# Patient Record
Sex: Male | Born: 1995 | Race: White | Hispanic: No | Marital: Single | Smoking: Never smoker
Health system: Southern US, Community
[De-identification: ages and names within clinical notes are randomized; demographics above are authoritative.]

---
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
## 2014-02-16 ENCOUNTER — Encounter (HOSPITAL_COMMUNITY): Payer: Self-pay | Admitting: Acute Care

## 2014-02-16 ENCOUNTER — Emergency Department (HOSPITAL_COMMUNITY): Payer: No Typology Code available for payment source

## 2014-02-16 ENCOUNTER — Inpatient Hospital Stay (HOSPITAL_COMMUNITY)
Admission: EM | Admit: 2014-02-16 | Discharge: 2014-02-20 | DRG: 815 | Disposition: A | Discharge status: Home / Self Care | Payer: No Typology Code available for payment source | Attending: General Surgery | Admitting: General Surgery

## 2014-02-16 DIAGNOSIS — R109 Unspecified abdominal pain: Secondary | ICD-10-CM | POA: Diagnosis not present

## 2014-02-16 DIAGNOSIS — D62 Acute posthemorrhagic anemia: Secondary | ICD-10-CM | POA: Diagnosis not present

## 2014-02-16 DIAGNOSIS — S42102A Fracture of unspecified part of scapula, left shoulder, initial encounter for closed fracture: Secondary | ICD-10-CM | POA: Diagnosis present

## 2014-02-16 DIAGNOSIS — S2239XA Fracture of one rib, unspecified side, initial encounter for closed fracture: Secondary | ICD-10-CM | POA: Diagnosis present

## 2014-02-16 DIAGNOSIS — S51012A Laceration without foreign body of left elbow, initial encounter: Secondary | ICD-10-CM | POA: Diagnosis present

## 2014-02-16 DIAGNOSIS — F1010 Alcohol abuse, uncomplicated: Secondary | ICD-10-CM | POA: Diagnosis present

## 2014-02-16 DIAGNOSIS — S3609XA Other injury of spleen, initial encounter: Secondary | ICD-10-CM | POA: Diagnosis not present

## 2014-02-16 DIAGNOSIS — S36039A Unspecified laceration of spleen, initial encounter: Secondary | ICD-10-CM

## 2014-02-16 DIAGNOSIS — IMO0002 Reserved for concepts with insufficient information to code with codable children: Secondary | ICD-10-CM | POA: Diagnosis present

## 2014-02-16 DIAGNOSIS — S42199A Fracture of other part of scapula, unspecified shoulder, initial encounter for closed fracture: Secondary | ICD-10-CM | POA: Diagnosis present

## 2014-02-16 DIAGNOSIS — S2232XA Fracture of one rib, left side, initial encounter for closed fracture: Secondary | ICD-10-CM | POA: Diagnosis present

## 2014-02-16 LAB — SAMPLE TO BLOOD BANK

## 2014-02-16 LAB — URINALYSIS, ROUTINE W REFLEX MICROSCOPIC
BILIRUBIN URINE: NEGATIVE
Glucose, UA: NEGATIVE mg/dL
KETONES UR: NEGATIVE mg/dL
Leukocytes, UA: NEGATIVE
Nitrite: NEGATIVE
PROTEIN: NEGATIVE mg/dL
Specific Gravity, Urine: 1.043 — ABNORMAL HIGH (ref 1.005–1.030)
Urobilinogen, UA: 1 mg/dL (ref 0.0–1.0)
pH: 6 (ref 5.0–8.0)

## 2014-02-16 LAB — I-STAT CHEM 8, ED
BUN: 12 mg/dL (ref 6–23)
CREATININE: 1.1 mg/dL (ref 0.50–1.35)
Calcium, Ion: 1.07 mmol/L — ABNORMAL LOW (ref 1.12–1.23)
Chloride: 108 mEq/L (ref 96–112)
GLUCOSE: 97 mg/dL (ref 70–99)
HCT: 41 % (ref 39.0–52.0)
HEMOGLOBIN: 13.9 g/dL (ref 13.0–17.0)
POTASSIUM: 4 meq/L (ref 3.7–5.3)
Sodium: 136 mEq/L — ABNORMAL LOW (ref 137–147)
TCO2: 25 mmol/L (ref 0–100)

## 2014-02-16 LAB — URINE MICROSCOPIC-ADD ON

## 2014-02-16 LAB — COMPREHENSIVE METABOLIC PANEL
ALT: 18 U/L (ref 0–53)
AST: 35 U/L (ref 0–37)
Albumin: 4.3 g/dL (ref 3.5–5.2)
Alkaline Phosphatase: 89 U/L (ref 39–117)
Anion gap: 13 (ref 5–15)
BUN: 13 mg/dL (ref 6–23)
CALCIUM: 9.1 mg/dL (ref 8.4–10.5)
CO2: 25 mEq/L (ref 19–32)
Chloride: 101 mEq/L (ref 96–112)
Creatinine, Ser: 1.02 mg/dL (ref 0.50–1.35)
GFR calc non Af Amer: >90 mL/min (ref >90)
Glucose, Bld: 96 mg/dL (ref 70–99)
Potassium: 4.3 mEq/L (ref 3.7–5.3)
SODIUM: 139 meq/L (ref 137–147)
TOTAL PROTEIN: 7.1 g/dL (ref 6.0–8.3)
Total Bilirubin: 1 mg/dL (ref 0.3–1.2)

## 2014-02-16 LAB — ETHANOL: ALCOHOL ETHYL (B): 56 mg/dL — AB (ref 0–11)

## 2014-02-16 LAB — CBC
HCT: 38.6 % — ABNORMAL LOW (ref 39.0–52.0)
HEMOGLOBIN: 13.5 g/dL (ref 13.0–17.0)
MCH: 28.6 pg (ref 26.0–34.0)
MCHC: 35 g/dL (ref 30.0–36.0)
MCV: 81.8 fL (ref 78.0–100.0)
Platelets: 231 10*3/uL (ref 150–400)
RBC: 4.72 MIL/uL (ref 4.22–5.81)
RDW: 13.2 % (ref 11.5–15.5)
WBC: 21 10*3/uL — ABNORMAL HIGH (ref 4.0–10.5)

## 2014-02-16 LAB — HEMOGLOBIN AND HEMATOCRIT, BLOOD
HCT: 39.9 % (ref 39.0–52.0)
HEMATOCRIT: 38 % — AB (ref 39.0–52.0)
HEMOGLOBIN: 14 g/dL (ref 13.0–17.0)
Hemoglobin: 12.9 g/dL — ABNORMAL LOW (ref 13.0–17.0)

## 2014-02-16 LAB — PROTIME-INR
INR: 1.11 (ref 0.00–1.49)
Prothrombin Time: 14.3 seconds (ref 11.6–15.2)

## 2014-02-16 LAB — I-STAT CG4 LACTIC ACID, ED: Lactic Acid, Venous: 1.6 mmol/L (ref 0.5–2.2)

## 2014-02-16 LAB — MRSA PCR SCREENING: MRSA by PCR: NEGATIVE

## 2014-02-16 MED ORDER — PANTOPRAZOLE SODIUM 40 MG PO TBEC
40.0000 mg | DELAYED_RELEASE_TABLET | Freq: Every day | ORAL | Status: DC
  Administered 2014-02-17: 40 mg via ORAL
  Filled 2014-02-16: qty 1

## 2014-02-16 MED ORDER — LACTATED RINGERS IV BOLUS (SEPSIS)
1000.0000 mL | Freq: Once | INTRAVENOUS | Status: AC
  Administered 2014-02-16: 1000 mL via INTRAVENOUS

## 2014-02-16 MED ORDER — HYDROMORPHONE HCL PF 1 MG/ML IJ SOLN
1.0000 mg | INTRAMUSCULAR | Status: DC | PRN
  Administered 2014-02-16 – 2014-02-17 (×3): 1 mg via INTRAVENOUS
  Filled 2014-02-16 (×3): qty 1

## 2014-02-16 MED ORDER — PANTOPRAZOLE SODIUM 40 MG IV SOLR
40.0000 mg | Freq: Every day | INTRAVENOUS | Status: DC
  Administered 2014-02-16: 40 mg via INTRAVENOUS
  Filled 2014-02-16 (×3): qty 40

## 2014-02-16 MED ORDER — DEXTROSE-NACL 5-0.9 % IV SOLN
INTRAVENOUS | Status: DC
  Administered 2014-02-16 – 2014-02-17 (×2): via INTRAVENOUS

## 2014-02-16 MED ORDER — SODIUM CHLORIDE 0.9 % IV SOLN: Freq: Once | INTRAVENOUS | Status: DC

## 2014-02-16 MED ORDER — ONDANSETRON HCL 4 MG/2ML IJ SOLN: 4.0000 mg | Freq: Four times a day (QID) | INTRAMUSCULAR | Status: DC | PRN

## 2014-02-16 MED ORDER — HYDROMORPHONE HCL PF 1 MG/ML IJ SOLN
1.0000 mg | Freq: Once | INTRAMUSCULAR | Status: AC
  Administered 2014-02-16: 1 mg via INTRAVENOUS
  Filled 2014-02-16: qty 1

## 2014-02-16 MED ORDER — ONDANSETRON HCL 4 MG/2ML IJ SOLN
4.0000 mg | Freq: Once | INTRAMUSCULAR | Status: AC
  Administered 2014-02-16: 4 mg via INTRAVENOUS
  Filled 2014-02-16: qty 2

## 2014-02-16 MED ORDER — SODIUM CHLORIDE 0.9 % IV BOLUS (SEPSIS)
1000.0000 mL | Freq: Once | INTRAVENOUS | Status: AC
  Administered 2014-02-16: 1000 mL via INTRAVENOUS

## 2014-02-16 MED ORDER — ONDANSETRON HCL 4 MG PO TABS: 4.0000 mg | ORAL_TABLET | Freq: Four times a day (QID) | ORAL | Status: DC | PRN

## 2014-02-16 MED ORDER — IOHEXOL 300 MG/ML  SOLN
80.0000 mL | Freq: Once | INTRAMUSCULAR | Status: AC | PRN
  Administered 2014-02-16: 80 mL via INTRAVENOUS

## 2014-02-16 NOTE — ED Notes (Signed)
Patient transported to CT 

## 2014-02-16 NOTE — ED Notes (Signed)
Dr Derrell Lollingramirez here to eval pt
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
## 2014-02-16 NOTE — ED Notes (Signed)
Report given to angela, rn
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
## 2014-02-17 DIAGNOSIS — D62 Acute posthemorrhagic anemia: Secondary | ICD-10-CM | POA: Diagnosis not present

## 2014-02-17 DIAGNOSIS — S2232XA Fracture of one rib, left side, initial encounter for closed fracture: Secondary | ICD-10-CM | POA: Diagnosis present

## 2014-02-17 DIAGNOSIS — R109 Unspecified abdominal pain: Secondary | ICD-10-CM | POA: Diagnosis present

## 2014-02-17 DIAGNOSIS — IMO0002 Reserved for concepts with insufficient information to code with codable children: Secondary | ICD-10-CM | POA: Diagnosis present

## 2014-02-17 DIAGNOSIS — S42199A Fracture of other part of scapula, unspecified shoulder, initial encounter for closed fracture: Secondary | ICD-10-CM | POA: Diagnosis present

## 2014-02-17 DIAGNOSIS — S3609XA Other injury of spleen, initial encounter: Secondary | ICD-10-CM | POA: Diagnosis present

## 2014-02-17 DIAGNOSIS — F1010 Alcohol abuse, uncomplicated: Secondary | ICD-10-CM | POA: Diagnosis present

## 2014-02-17 DIAGNOSIS — S42109A Fracture of unspecified part of scapula, unspecified shoulder, initial encounter for closed fracture: Secondary | ICD-10-CM

## 2014-02-17 DIAGNOSIS — S2239XA Fracture of one rib, unspecified side, initial encounter for closed fracture: Secondary | ICD-10-CM

## 2014-02-17 DIAGNOSIS — S36039A Unspecified laceration of spleen, initial encounter: Secondary | ICD-10-CM | POA: Diagnosis present

## 2014-02-17 DIAGNOSIS — S42102A Fracture of unspecified part of scapula, left shoulder, initial encounter for closed fracture: Secondary | ICD-10-CM | POA: Diagnosis present

## 2014-02-17 DIAGNOSIS — S51012A Laceration without foreign body of left elbow, initial encounter: Secondary | ICD-10-CM | POA: Diagnosis present

## 2014-02-17 LAB — BASIC METABOLIC PANEL
Anion gap: 10 (ref 5–15)
BUN: 9 mg/dL (ref 6–23)
CALCIUM: 8.5 mg/dL (ref 8.4–10.5)
CO2: 24 mEq/L (ref 19–32)
Chloride: 104 mEq/L (ref 96–112)
Creatinine, Ser: 0.98 mg/dL (ref 0.50–1.35)
GFR calc Af Amer: >90 mL/min (ref >90)
GFR calc non Af Amer: >90 mL/min (ref >90)
GLUCOSE: 122 mg/dL — AB (ref 70–99)
Potassium: 3.6 mEq/L — ABNORMAL LOW (ref 3.7–5.3)
Sodium: 138 mEq/L (ref 137–147)

## 2014-02-17 LAB — CBC
HEMATOCRIT: 37.4 % — AB (ref 39.0–52.0)
Hemoglobin: 12.9 g/dL — ABNORMAL LOW (ref 13.0–17.0)
MCH: 28.7 pg (ref 26.0–34.0)
MCHC: 34.5 g/dL (ref 30.0–36.0)
MCV: 83.3 fL (ref 78.0–100.0)
Platelets: 188 10*3/uL (ref 150–400)
RBC: 4.49 MIL/uL (ref 4.22–5.81)
RDW: 13.2 % (ref 11.5–15.5)
WBC: 11.4 10*3/uL — ABNORMAL HIGH (ref 4.0–10.5)

## 2014-02-17 LAB — HEMOGLOBIN AND HEMATOCRIT, BLOOD
HCT: 41.6 % (ref 39.0–52.0)
HEMATOCRIT: 40.8 % (ref 39.0–52.0)
Hemoglobin: 14 g/dL (ref 13.0–17.0)
Hemoglobin: 14.1 g/dL (ref 13.0–17.0)

## 2014-02-17 MED ORDER — OXYCODONE HCL 5 MG PO TABS
5.0000 mg | ORAL_TABLET | ORAL | Status: DC | PRN
  Administered 2014-02-17 – 2014-02-20 (×6): 10 mg via ORAL
  Filled 2014-02-17 (×3): qty 2
  Filled 2014-02-17: qty 3
  Filled 2014-02-17 (×4): qty 2

## 2014-02-17 MED ORDER — DOCUSATE SODIUM 100 MG PO CAPS
100.0000 mg | ORAL_CAPSULE | Freq: Two times a day (BID) | ORAL | Status: DC
  Administered 2014-02-17 – 2014-02-19 (×6): 100 mg via ORAL
  Filled 2014-02-17 (×6): qty 1

## 2014-02-17 MED ORDER — MORPHINE SULFATE 2 MG/ML IJ SOLN: 2.0000 mg | INTRAMUSCULAR | Status: DC | PRN

## 2014-02-17 MED ORDER — BACITRACIN ZINC 500 UNIT/GM EX OINT
TOPICAL_OINTMENT | Freq: Two times a day (BID) | CUTANEOUS | Status: DC
  Administered 2014-02-17: 11:00:00 via TOPICAL
  Administered 2014-02-18: 1 via TOPICAL
  Administered 2014-02-19 (×2): via TOPICAL
  Filled 2014-02-17: qty 28.35
  Filled 2014-02-17 (×2): qty 15

## 2014-02-17 MED ORDER — SODIUM CHLORIDE 0.9 % IJ SOLN: 3.0000 mL | INTRAMUSCULAR | Status: DC | PRN

## 2014-02-17 MED ORDER — POLYETHYLENE GLYCOL 3350 17 G PO PACK
17.0000 g | PACK | Freq: Every day | ORAL | Status: DC
  Administered 2014-02-17 – 2014-02-19 (×3): 17 g via ORAL
  Filled 2014-02-17 (×6): qty 1

## 2014-02-17 NOTE — Progress Notes (Signed)
Patient is stable and I will allow him to eat.  Bedrest through tomorrow.  Ambulate on Wednesday.  Likely home on Thursday.  This patient has been seen and I agree with the findings and treatment plan.  Marta LamasJames O. Gae BonWyatt, III, MD, FACS [phone] (pager) [phone] (direct pager) Trauma Surgeon

## 2014-02-17 NOTE — Progress Notes (Signed)
Orthopedic Tech Progress Note Patient Details:  Micheal Bates 11/24/1995 [id number]  Ortho Devices Type of Ortho Device: Arm sling Ortho Device/Splint Location: LUE at bedside Ortho Device/Splint Interventions: Casandra DoffingOrdered   Aaliyah Gavel, Rylan Craig 02/17/2014, 7:35 PM
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
## 2014-02-18 LAB — HEMOGLOBIN AND HEMATOCRIT, BLOOD
HCT: 40.5 % (ref 39.0–52.0)
HEMATOCRIT: 41.2 % (ref 39.0–52.0)
HEMOGLOBIN: 13.8 g/dL (ref 13.0–17.0)
HEMOGLOBIN: 14.2 g/dL (ref 13.0–17.0)

## 2014-02-18 NOTE — Progress Notes (Signed)
Ambulate tomorrow.  Recheck labs and home on Thursday.  This patient has been seen and I agree with the findings and treatment plan.  Marta LamasJames O. Gae BonWyatt, III, MD, FACS [phone] (pager) [phone] (direct pager) Trauma Surgeon
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
## 2014-02-19 LAB — CBC
HEMATOCRIT: 40.8 % (ref 39.0–52.0)
Hemoglobin: 14 g/dL (ref 13.0–17.0)
MCH: 28.1 pg (ref 26.0–34.0)
MCHC: 34.3 g/dL (ref 30.0–36.0)
MCV: 81.9 fL (ref 78.0–100.0)
Platelets: 194 10*3/uL (ref 150–400)
RBC: 4.98 MIL/uL (ref 4.22–5.81)
RDW: 13 % (ref 11.5–15.5)
WBC: 7 10*3/uL (ref 4.0–10.5)
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
## 2014-02-19 NOTE — Progress Notes (Signed)
Hemoglobin stable.  Will ambulate and send home tomorrow as long as he remains stable.  He needs a letter to go back to work in 3 weeks. This patient has been seen and I agree with the findings and treatment plan.  Marta LamasJames O. Gae BonWyatt, III, MD, FACS [phone] (pager) [phone] (direct pager) Trauma Surgeon
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
## 2014-02-20 ENCOUNTER — Encounter (INDEPENDENT_AMBULATORY_CARE_PROVIDER_SITE_OTHER): Payer: Self-pay | Admitting: Orthopedic Surgery

## 2014-02-20 LAB — CBC
HCT: 39.1 % (ref 39.0–52.0)
Hemoglobin: 13.8 g/dL (ref 13.0–17.0)
MCH: 28.5 pg (ref 26.0–34.0)
MCHC: 35.3 g/dL (ref 30.0–36.0)
MCV: 80.8 fL (ref 78.0–100.0)
PLATELETS: 194 10*3/uL (ref 150–400)
RBC: 4.84 MIL/uL (ref 4.22–5.81)
RDW: 12.7 % (ref 11.5–15.5)
WBC: 6.1 10*3/uL (ref 4.0–10.5)

## 2014-02-20 MED ORDER — OXYCODONE-ACETAMINOPHEN 5-325 MG PO TABS: 1.0000 | ORAL_TABLET | ORAL | Status: DC | PRN

## 2014-02-20 NOTE — Discharge Instructions (Signed)
No running, jumping, ball or contact sports, bikes, skateboards, etc for 3 months.  No driving while taking oxycodone.

## 2014-02-20 NOTE — Discharge Summary (Signed)
Enez Monahan, MD, MPH, FACS Trauma: [phone] General Surgery: [phone]  
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
## 2014-02-20 NOTE — Progress Notes (Signed)
D/C Jobie Popp, MD, MPH, FACS Trauma: [phone] General Surgery: [phone]  

## 2014-02-20 NOTE — Progress Notes (Signed)
Discharge paperwork and prescriptions given to patient. Patient is prepared for discharge. No questons verbalized by patient or family.
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
## 2014-03-11 ENCOUNTER — Encounter (HOSPITAL_COMMUNITY): Payer: Self-pay | Admitting: Emergency Medicine

## 2014-03-11 ENCOUNTER — Emergency Department (HOSPITAL_COMMUNITY)
Admission: EM | Admit: 2014-03-11 | Discharge: 2014-03-11 | Disposition: A | Discharge status: Home / Self Care | Payer: BC Managed Care – PPO | Attending: Emergency Medicine | Admitting: Emergency Medicine

## 2014-03-11 DIAGNOSIS — Z8781 Personal history of (healed) traumatic fracture: Secondary | ICD-10-CM | POA: Diagnosis not present

## 2014-03-11 DIAGNOSIS — M25512 Pain in left shoulder: Secondary | ICD-10-CM

## 2014-03-11 DIAGNOSIS — Y92410 Unspecified street and highway as the place of occurrence of the external cause: Secondary | ICD-10-CM | POA: Insufficient documentation

## 2014-03-11 DIAGNOSIS — S46909A Unspecified injury of unspecified muscle, fascia and tendon at shoulder and upper arm level, unspecified arm, initial encounter: Secondary | ICD-10-CM | POA: Diagnosis not present

## 2014-03-11 DIAGNOSIS — Y9389 Activity, other specified: Secondary | ICD-10-CM | POA: Diagnosis not present

## 2014-03-11 DIAGNOSIS — S4980XA Other specified injuries of shoulder and upper arm, unspecified arm, initial encounter: Secondary | ICD-10-CM | POA: Diagnosis present

## 2014-03-11 DIAGNOSIS — X500XXA Overexertion from strenuous movement or load, initial encounter: Secondary | ICD-10-CM | POA: Diagnosis not present
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
## 2014-03-11 NOTE — Consult Note (Signed)
I have reviewed and discussed in detail with Mr. Micheal Bates the patient's presentation, examination findings, and I formulated the plan outlined above.  I have seen and examined the patient. I agree with the findings above.   Myrene Galas, MD [facility] [phone] (p)
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
## 2014-03-11 NOTE — ED Provider Notes (Signed)
Medical screening examination/treatment/procedure(s) were performed by non-physician practitioner and as supervising physician I was immediately available for consultation/collaboration.     Linwood Dibbles, MD 03/11/14 [phone]
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
## 2015-05-09 ENCOUNTER — Emergency Department
Admission: EM | Admit: 2015-05-09 | Discharge: 2015-05-09 | Disposition: A | Discharge status: Home / Self Care | Payer: BLUE CROSS/BLUE SHIELD | Attending: Emergency Medicine | Admitting: Emergency Medicine

## 2015-05-09 ENCOUNTER — Encounter: Payer: Self-pay | Admitting: Emergency Medicine

## 2015-05-09 DIAGNOSIS — S0003XA Contusion of scalp, initial encounter: Secondary | ICD-10-CM | POA: Diagnosis not present

## 2015-05-09 DIAGNOSIS — Y92410 Unspecified street and highway as the place of occurrence of the external cause: Secondary | ICD-10-CM | POA: Insufficient documentation

## 2015-05-09 DIAGNOSIS — S50312A Abrasion of left elbow, initial encounter: Secondary | ICD-10-CM | POA: Insufficient documentation

## 2015-05-09 DIAGNOSIS — S0990XA Unspecified injury of head, initial encounter: Secondary | ICD-10-CM | POA: Diagnosis present

## 2015-05-09 DIAGNOSIS — Y998 Other external cause status: Secondary | ICD-10-CM | POA: Diagnosis not present

## 2015-05-09 DIAGNOSIS — Y9389 Activity, other specified: Secondary | ICD-10-CM | POA: Diagnosis not present
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
## 2015-05-13 ENCOUNTER — Emergency Department: Payer: BLUE CROSS/BLUE SHIELD

## 2015-05-13 ENCOUNTER — Emergency Department
Admission: EM | Admit: 2015-05-13 | Discharge: 2015-05-13 | Disposition: A | Discharge status: Home / Self Care | Payer: BLUE CROSS/BLUE SHIELD | Attending: Emergency Medicine | Admitting: Emergency Medicine

## 2015-05-13 ENCOUNTER — Encounter: Payer: Self-pay | Admitting: *Deleted

## 2015-05-13 DIAGNOSIS — G44309 Post-traumatic headache, unspecified, not intractable: Secondary | ICD-10-CM | POA: Diagnosis not present

## 2015-05-13 DIAGNOSIS — R51 Headache: Secondary | ICD-10-CM | POA: Diagnosis present

## 2015-05-13 MED ORDER — BUTALBITAL-APAP-CAFFEINE 50-325-40 MG PO TABS: 1.0000 | ORAL_TABLET | Freq: Four times a day (QID) | ORAL | Status: AC | PRN
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
## 2019-04-16 ENCOUNTER — Other Ambulatory Visit: Payer: Self-pay

## 2019-04-16 ENCOUNTER — Emergency Department (HOSPITAL_COMMUNITY)
Admission: EM | Admit: 2019-04-16 | Discharge: 2019-04-17 | Disposition: A | Discharge status: Home / Self Care | Payer: BC Managed Care – PPO | Attending: Emergency Medicine | Admitting: Emergency Medicine

## 2019-04-16 ENCOUNTER — Encounter (HOSPITAL_COMMUNITY): Payer: Self-pay

## 2019-04-16 DIAGNOSIS — Y999 Unspecified external cause status: Secondary | ICD-10-CM | POA: Diagnosis not present

## 2019-04-16 DIAGNOSIS — Y929 Unspecified place or not applicable: Secondary | ICD-10-CM | POA: Diagnosis not present

## 2019-04-16 DIAGNOSIS — Z23 Encounter for immunization: Secondary | ICD-10-CM | POA: Insufficient documentation

## 2019-04-16 DIAGNOSIS — S61012A Laceration without foreign body of left thumb without damage to nail, initial encounter: Secondary | ICD-10-CM | POA: Diagnosis not present

## 2019-04-16 DIAGNOSIS — W268XXA Contact with other sharp object(s), not elsewhere classified, initial encounter: Secondary | ICD-10-CM | POA: Diagnosis not present

## 2019-04-16 DIAGNOSIS — Y9389 Activity, other specified: Secondary | ICD-10-CM | POA: Diagnosis not present

## 2019-04-16 MED ORDER — POVIDONE-IODINE 10 % EX SOLN
CUTANEOUS | Status: AC
  Administered 2019-04-17
  Filled 2019-04-16: qty 30
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
## 2019-04-17 MED ORDER — LIDOCAINE HCL (PF) 1 % IJ SOLN
10.0000 mL | Freq: Once | INTRAMUSCULAR | Status: AC
  Administered 2019-04-17: 10 mL
  Filled 2019-04-17: qty 30

## 2019-04-17 MED ORDER — ONDANSETRON HCL 4 MG PO TABS
4.0000 mg | ORAL_TABLET | Freq: Once | ORAL | Status: AC
Start: 2019-04-17 — End: 2019-04-17
  Administered 2019-04-17: 4 mg via ORAL
  Filled 2019-04-17: qty 1

## 2019-04-17 MED ORDER — HYDROCODONE-ACETAMINOPHEN 5-325 MG PO TABS
2.0000 | ORAL_TABLET | Freq: Once | ORAL | Status: AC
  Administered 2019-04-17: 2 via ORAL
  Filled 2019-04-17: qty 2

## 2019-04-17 MED ORDER — DOXYCYCLINE HYCLATE 100 MG PO TABS
100.0000 mg | ORAL_TABLET | Freq: Once | ORAL | Status: AC
  Administered 2019-04-17: 100 mg via ORAL
  Filled 2019-04-17: qty 1

## 2019-04-17 MED ORDER — TETANUS-DIPHTH-ACELL PERTUSSIS 5-2.5-18.5 LF-MCG/0.5 IM SUSP
0.5000 mL | Freq: Once | INTRAMUSCULAR | Status: AC
  Administered 2019-04-17: 0.5 mL via INTRAMUSCULAR
  Filled 2019-04-17: qty 0.5

## 2019-04-17 MED ORDER — DOXYCYCLINE HYCLATE 100 MG PO CAPS: 100.0000 mg | ORAL_CAPSULE | Freq: Two times a day (BID) | ORAL | 0 refills | Status: AC
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
## 2019-04-17 NOTE — Discharge Instructions (Signed)
Your wound was repaired with stitches.  You have 8 stitches on the outside.  These should be removed on Tuesday, October 20.  Please see your primary physician, or urgent care, or return to this emergency department to have them removed.  Please return to the emergency department sooner if any signs of infection.  Use Tylenol every 4 hours if needed for soreness.  Please use doxycycline 2 times daily as a prevention for infection.  Please take this medication with FOOD.

## 2022-09-08 ENCOUNTER — Emergency Department (HOSPITAL_COMMUNITY)
Admission: EM | Admit: 2022-09-08 | Discharge: 2022-09-08 | Disposition: A | Discharge status: Home / Self Care | Payer: BC Managed Care – PPO | Attending: Emergency Medicine | Admitting: Emergency Medicine

## 2022-09-08 ENCOUNTER — Emergency Department (HOSPITAL_COMMUNITY): Payer: BC Managed Care – PPO

## 2022-09-08 ENCOUNTER — Other Ambulatory Visit: Payer: Self-pay

## 2022-09-08 DIAGNOSIS — R0789 Other chest pain: Secondary | ICD-10-CM | POA: Insufficient documentation

## 2022-09-08 MED ORDER — LIDOCAINE 5 % EX PTCH
1.0000 | MEDICATED_PATCH | CUTANEOUS | Status: DC
  Administered 2022-09-08: 1 via TRANSDERMAL
  Filled 2022-09-08: qty 1

## 2022-09-08 MED ORDER — IBUPROFEN 800 MG PO TABS
800.0000 mg | ORAL_TABLET | Freq: Once | ORAL | Status: AC
  Administered 2022-09-08: 800 mg via ORAL
  Filled 2022-09-08: qty 1

## 2022-09-08 NOTE — ED Triage Notes (Signed)
Pt states his right side of his chest has been hurting x 2 weeks.  States when moves or takes a deep breath t hurts worse.  Denies N/V/D, SOB

## 2022-09-08 NOTE — Discharge Instructions (Signed)
As we discussed, I believe this is likely costochondritis or muscular related.  I would like for you to take 600 mg of ibuprofen every 6 hours.  You can Stach on Tylenol at the 3 to 4-hour mark.  I would try this for a week.  Like for you to return to the emergency department for any worsening symptoms you might have.

## 2022-09-08 NOTE — ED Provider Notes (Signed)
[facility] Provider Note   CSN: [id number] Arrival date & time: 09/08/22  1538     History Chief Complaint  Patient presents with   Chest Pain    Micheal Bates is a 27 y.o. male patient who presents to the emergency department with right-sided anterior chest wall pain has been ongoing for last 2 weeks.  He states is worse with rotation of the chest wall and with deep inhalation.  He states that since golfing a couple of weeks ago he noticed that has been progressively worse.  He denies any cough, fever, recent travel, recent surgeries.  Denies any trauma as well.   Chest Pain      Home Medications Prior to Admission medications   Medication Sig Start Date End Date Taking? Authorizing Provider  butalbital-acetaminophen-caffeine (FIORICET) 50-325-40 MG tablet Take 1-2 tablets by mouth every 6 (six) hours as needed for headache. 05/13/15   Sable Feil, PA-C  doxycycline (VIBRAMYCIN) 100 MG capsule Take 1 capsule (100 mg total) by mouth 2 (two) times daily. 04/17/19   Lily Kocher, PA-C  oxyCODONE-acetaminophen (ROXICET) 5-325 MG per tablet Take 1-2 tablets by mouth every 4 (four) hours as needed (Pain). 02/20/14   Lisette Abu, PA-C      Allergies    Shellfish allergy    Review of Systems   Review of Systems  Cardiovascular:  Positive for chest pain.  All other systems reviewed and are negative.   Physical Exam Updated Vital Signs BP 133/76 (BP Location: Right Arm)   Pulse 82   Temp 98 F (36.7 C) (Oral)   Resp 16   Ht '5\' 9"'$  (1.753 m)   Wt 74.8 kg   SpO2 100%   BMI 24.37 kg/m  Physical Exam Vitals and nursing note reviewed.  Constitutional:      Appearance: Normal appearance.  HENT:     Head: Normocephalic and atraumatic.  Eyes:     General:        Right eye: No discharge.        Left eye: No discharge.     Conjunctiva/sclera: Conjunctivae normal.  Pulmonary:     Effort: Pulmonary effort is  normal.  Chest:     Comments: Anterior chest wall on the right is tender to palpation inferior and lateral to the nipple. Skin:    General: Skin is warm and dry.     Findings: No rash.  Neurological:     General: No focal deficit present.     Mental Status: He is alert.  Psychiatric:        Mood and Affect: Mood normal.        Behavior: Behavior normal.     ED Results / Procedures / Treatments   Labs (all labs ordered are listed, but only abnormal results are displayed) Labs Reviewed - No data to display  EKG None  Radiology DG Ribs Unilateral W/Chest Right  Result Date: 09/08/2022 CLINICAL DATA:  Chest wall pain.  Right-sided pain for 2 weeks. EXAM: RIGHT RIBS AND CHEST - 3+ VIEW COMPARISON:  Chest CT 02/16/2014 FINDINGS: No fracture or other bone lesions are seen involving the ribs. There is no evidence of pneumothorax or pleural effusion. Both lungs are clear. Heart size and mediastinal contours are within normal limits. IMPRESSION: Negative radiographs of the chest and right ribs. Electronically Signed   By: Keith Rake M.D.   On: 09/08/2022 18:11    Procedures Procedures   Medications  Ordered in ED Medications  lidocaine (LIDODERM) 5 % 1 patch (1 patch Transdermal Patch Applied 09/08/22 1846)  ibuprofen (ADVIL) tablet 800 mg (800 mg Oral Given 09/08/22 1846)    ED Course/ Medical Decision Making/ A&P   {    Medical Decision Making Micheal Bates is a 27 y.o. male patient who presents to the emergency department today for further evaluation of right-sided chest wall pain.  There is tenderness to palpation over the right anterior chest wall.  Patient has not had any indications of pneumonia including fever, cough.  No trauma to indicate any flail chest.  Patient is otherwise young and healthy and not on any hormone replacement.  I have a low suspicion at this time for pulmonary embolism.  I will plan to get a chest x-ray to evaluate for rib injury and other lung  pathology.  I will plan to give him ibuprofen and Lidoderm patch and reassess.  Patient states that the Lidoderm patch did not improve his pain much.  I went over the imaging with him at the bedside.  I have a low suspicion for any emergent pathology at this time.  I will put him on conservative therapy with NSAIDs for the next week.  He was encouraged to return to the emergency department for any worsening symptoms.   Amount and/or Complexity of Data Reviewed Radiology: ordered.  Risk Prescription drug management.    Final Clinical Impression(s) / ED Diagnoses Final diagnoses:  Chest wall pain    Rx / DC Orders ED Discharge Orders     None         Cherrie Gauze 09/08/22 1901    Milton Ferguson, MD 09/09/22 1236

## 2023-07-09 ENCOUNTER — Emergency Department (HOSPITAL_COMMUNITY)
Admission: EM | Admit: 2023-07-09 | Discharge: 2023-07-09 | Disposition: A | Discharge status: Home / Self Care | Payer: BC Managed Care – PPO | Attending: Emergency Medicine | Admitting: Emergency Medicine

## 2023-07-09 ENCOUNTER — Other Ambulatory Visit: Payer: Self-pay

## 2023-07-09 ENCOUNTER — Encounter (HOSPITAL_COMMUNITY): Payer: Self-pay | Admitting: *Deleted

## 2023-07-09 ENCOUNTER — Emergency Department (HOSPITAL_COMMUNITY): Payer: BC Managed Care – PPO

## 2023-07-09 DIAGNOSIS — W19XXXA Unspecified fall, initial encounter: Secondary | ICD-10-CM | POA: Diagnosis not present

## 2023-07-09 DIAGNOSIS — S52572A Other intraarticular fracture of lower end of left radius, initial encounter for closed fracture: Secondary | ICD-10-CM | POA: Insufficient documentation

## 2023-07-09 DIAGNOSIS — M7989 Other specified soft tissue disorders: Secondary | ICD-10-CM | POA: Diagnosis not present

## 2023-07-09 DIAGNOSIS — S6991XA Unspecified injury of right wrist, hand and finger(s), initial encounter: Secondary | ICD-10-CM | POA: Diagnosis present

## 2023-07-09 MED ORDER — IBUPROFEN 600 MG PO TABS: 600.0000 mg | ORAL_TABLET | Freq: Four times a day (QID) | ORAL | 0 refills | Status: AC | PRN

## 2023-07-09 MED ORDER — HYDROCODONE-ACETAMINOPHEN 5-325 MG PO TABS: 1.0000 | ORAL_TABLET | Freq: Four times a day (QID) | ORAL | 0 refills | Status: DC | PRN

## 2023-07-09 MED ORDER — ACETAMINOPHEN 500 MG PO TABS
1000.0000 mg | ORAL_TABLET | Freq: Once | ORAL | Status: AC
  Administered 2023-07-09: 1000 mg via ORAL
  Filled 2023-07-09: qty 2

## 2023-07-09 MED ORDER — IBUPROFEN 400 MG PO TABS
600.0000 mg | ORAL_TABLET | Freq: Once | ORAL | Status: AC
  Administered 2023-07-09: 600 mg via ORAL
  Filled 2023-07-09: qty 2

## 2023-07-09 NOTE — ED Triage Notes (Signed)
 Pt fell yesterday and with left wrist pain.  Swelling noted to left hand.  Denies hitting his head with fall.

## 2023-07-09 NOTE — ED Provider Notes (Signed)
 [facility] EMERGENCY DEPARTMENT AT [facility] Provider Note   CSN: [id number] Arrival date & time: 07/09/23  1232     History  Chief Complaint  Patient presents with   Wrist Pain    Micheal Bates is a 28 y.o. male.   Wrist Pain       Home Medications Prior to Admission medications   Medication Sig Start Date End Date Taking? Authorizing Provider  HYDROcodone -acetaminophen  (NORCO) 5-325 MG tablet Take 1 tablet by mouth every 6 (six) hours as needed for moderate pain (pain score 4-6). 07/09/23  Yes Jilliann Subramanian A, PA-C  ibuprofen  (ADVIL ) 600 MG tablet Take 1 tablet (600 mg total) by mouth every 6 (six) hours as needed. 07/09/23  Yes Amarah Brossman A, PA-C  butalbital -acetaminophen -caffeine  (FIORICET) 50-325-40 MG tablet Take 1-2 tablets by mouth every 6 (six) hours as needed for headache. 05/13/15   Claudene Tanda POUR, PA-C  doxycycline  (VIBRAMYCIN ) 100 MG capsule Take 1 capsule (100 mg total) by mouth 2 (two) times daily. 04/17/19   Armida Culver, PA-C  oxyCODONE -acetaminophen  (ROXICET) 5-325 MG per tablet Take 1-2 tablets by mouth every 4 (four) hours as needed (Pain). 02/20/14   Jeffery, Michael J, PA-C      Allergies    Patient has no known allergies.    Review of Systems   Review of Systems  Physical Exam Updated Vital Signs BP 97/65 (BP Location: Right Arm)   Pulse 83   Temp 98.3 F (36.8 C) (Oral)   Resp 16   Ht 5' 8 (1.727 m)   Wt 74.8 kg   SpO2 100%   BMI 25.09 kg/m  Physical Exam Vitals and nursing note reviewed.  Constitutional:      General: He is not in acute distress.    Appearance: He is well-developed.  HENT:     Head: Normocephalic and atraumatic.  Eyes:     Conjunctiva/sclera: Conjunctivae normal.  Cardiovascular:     Rate and Rhythm: Normal rate and regular rhythm.     Heart sounds: No murmur heard. Pulmonary:     Effort: Pulmonary effort is normal. No respiratory distress.     Breath sounds: Normal breath sounds.   Abdominal:     Palpations: Abdomen is soft.     Tenderness: There is no abdominal tenderness.  Musculoskeletal:        General: Swelling present.     Cervical back: Neck supple.     Comments: Soft tissue swelling to the dorsum of the left hand and diffusely to the left wrist.  Point tenderness to the distal radius, radial pulses 2+.  Patient can make okay sign, thumbs up and abduct and adduct his fingers, and also he can flex and extend the wrist but has pain with extension.  Capillary refill is brisk in the fingers.  Skin:    General: Skin is warm and dry.     Capillary Refill: Capillary refill takes less than 2 seconds.  Neurological:     General: No focal deficit present.     Mental Status: He is alert and oriented to person, place, and time.  Psychiatric:        Mood and Affect: Mood normal.     ED Results / Procedures / Treatments   Labs (all labs ordered are listed, but only abnormal results are displayed) Labs Reviewed - No data to display  EKG None  Radiology DG Wrist Complete Left Result Date: 07/09/2023 CLINICAL DATA:  Fall, pain and swelling EXAM: LEFT  WRIST - COMPLETE 3+ VIEW COMPARISON:  None Available. FINDINGS: Nondisplaced, comminuted intra-articular fractures of the distal left radius. The distal ulna is intact and the carpus remains normally aligned. Soft tissue edema about the wrist. IMPRESSION: Nondisplaced, comminuted intra-articular fractures of the distal left radius. Electronically Signed   By: Marolyn JONETTA Jaksch M.D.   On: 07/09/2023 13:43    Procedures .Splint Application  Date/Time: 07/09/2023 2:12 PM  Performed by: Suellen Sherran LABOR, PA-C Authorized by: Suellen Sherran LABOR, PA-C   Consent:    Consent obtained:  Verbal   Consent given by:  Patient   Risks discussed:  Discoloration, numbness, pain and swelling Universal protocol:    Patient identity confirmed:  Verbally with patient Pre-procedure details:    Distal neurologic exam:  Normal   Distal  perfusion: distal pulses strong and brisk capillary refill   Procedure details:    Location:  Wrist   Wrist location:  L wrist   Cast type:  Short arm   Splint type:  Sugar tong   Supplies:  Elastic bandage, cotton padding, fiberglass and sling   Attestation: Splint applied and adjusted personally by me   Post-procedure details:    Distal neurologic exam:  Normal   Distal perfusion: distal pulses strong and brisk capillary refill     Procedure completion:  Tolerated well, no immediate complications     Medications Ordered in ED Medications  acetaminophen  (TYLENOL ) tablet 1,000 mg (1,000 mg Oral Given 07/09/23 1351)  ibuprofen  (ADVIL ) tablet 600 mg (600 mg Oral Given 07/09/23 1352)    ED Course/ Medical Decision Making/ A&P                                 Medical Decision Making Ddx: fracture, sprain, strain, dislocation, other ED course: Patient had a trip and fall downstairs last night while carrying some objects and fell forward landing on outstretched left hand has pain and swelling to the distal radius, no snuffbox tenderness, no numbness tingling or weakness, hand function is intact.  No neurovascular compromise on exam.  Patient placed in a sugar-tong splint by me, given sling, discussed close outpatient orthopedic follow-up.  Fortunately the fracture while comminuted and intra-articular is nondisplaced and did not need any reduction.  He was given Tylenol  and ibuprofen  in the ER along with splint application and symptoms have improved.  He was given strict return precautions.  Discussed no use left arm until cleared by orthopedics, given work note.  Discussed no driving with left arm and splint and sling.  Amount and/or Complexity of Data Reviewed Radiology: ordered and independent interpretation performed.    Details: Comminuted intra-articular fracture of left distal radius, I agree with radiology read  Risk OTC drugs. Prescription drug management.           Final  Clinical Impression(s) / ED Diagnoses Final diagnoses:  Other closed intra-articular fracture of distal end of left radius, initial encounter    Rx / DC Orders ED Discharge Orders          Ordered    HYDROcodone -acetaminophen  (NORCO) 5-325 MG tablet  Every 6 hours PRN        07/09/23 1348    ibuprofen  (ADVIL ) 600 MG tablet  Every 6 hours PRN        07/09/23 1419              Suellen Sherran LABOR, PA-C 07/09/23 1424    Pickering,  Rankin, MD 07/09/23 [phone]

## 2023-07-09 NOTE — Discharge Instructions (Addendum)
 It was pleasure taking care of you today.  You are seen in the ER today and found to have a comminuted, intra-articular left distal radius fracture.  We put you in a splint.  Do not get this wet, do not put any weight on your left arm.  If you get color change to your fingers, numbness or tingling or severe pain that is not relieved with loosening the bandages come back to the ER right away.  Call the orthopedic office tomorrow for close follow-up.  You were prescribed opiate pain medication. This can have many sided effect such as drowsiness, slowed breathing, and conspitation. Do not take it with other medications that cause drowsiness, or drink alcohol while taking it. Take a stool softener over the counter while taking it.

## 2023-07-11 ENCOUNTER — Encounter: Payer: Self-pay | Admitting: Orthopedic Surgery

## 2023-07-11 ENCOUNTER — Ambulatory Visit: Payer: BC Managed Care – PPO | Admitting: Orthopedic Surgery

## 2023-07-11 ENCOUNTER — Other Ambulatory Visit (INDEPENDENT_AMBULATORY_CARE_PROVIDER_SITE_OTHER): Payer: Self-pay

## 2023-07-11 VITALS — BP 118/65 | HR 77 | Ht 68.0 in | Wt 165.0 lb

## 2023-07-11 DIAGNOSIS — W108XXA Fall (on) (from) other stairs and steps, initial encounter: Secondary | ICD-10-CM | POA: Diagnosis not present

## 2023-07-11 DIAGNOSIS — S52572A Other intraarticular fracture of lower end of left radius, initial encounter for closed fracture: Secondary | ICD-10-CM

## 2023-07-11 NOTE — Progress Notes (Signed)
 New Patient Visit  Assessment: Micheal Bates is a 28 y.o. male with the following: 1. Other closed intra-articular fracture of distal end of left radius, initial encounter  Plan: Micheal Bates fell while helping a friend move.  He sustained a distal radius fracture, with intra-articular extension.  Based on available imaging, there is no step-off within the joint line.  He was placed in a cast today.  Radiographs were obtained in the cast.  I am pleased with the overall alignment, and plan to proceed with nonoperative management.  Anticipate that he will require 4 to 6 weeks of immobilization, followed by progressive improvements in function and strength.  As such, he will likely be out of work for several months.   Cast application - Left short arm cast   Verbal consent was obtained and the correct extremity was identified. A well padded, appropriately molded short arm cast was applied to the Left arm Fingers remained warm and well perfused.   There were no sharp edges Patient tolerated the procedure well Cast care instructions were provided    Follow-up: Return in about 2 weeks (around 07/25/2023).  Subjective:  Chief Complaint  Patient presents with   Fracture    L Radius DOI 07/08/23    History of Present Illness: Micheal Bates is a 28 y.o. male who presents for evaluation of left wrist pain.  He is right-hand dominant.  He was helping a friend move, when he missed a step.  He attempted to brace his fall, and injured his left wrist.  He presented to the emergency department.  Radiographs in the emergency department demonstrates an intra-articular, distal radius fracture.  He is placed in a splint.  He was given pain medicine.  He continues take ibuprofen  only.  No numbness or tingling.  No prior injuries to his left wrist.   Review of Systems: No fevers or chills No numbness or tingling No chest pain No shortness of breath No bowel or bladder dysfunction No  GI distress No headaches   Medical History:  History reviewed. No pertinent past medical history.  History reviewed. No pertinent surgical history.  History reviewed. No pertinent family history. Social History   Tobacco Use   Smoking status: Never   Smokeless tobacco: Current    Types: Snuff  Substance Use Topics   Alcohol use: Yes    Comment: occ   Drug use: No    No Known Allergies  No outpatient medications have been marked as taking for the 07/11/23 encounter (Office Visit) with Micheal Micheal LABOR, MD.    Objective: BP 118/65   Pulse 77   Ht 5' 8 (1.727 m)   Wt 165 lb (74.8 kg)   BMI 25.09 kg/m   Physical Exam:  General: Alert and oriented. and No acute distress. Gait: Normal gait.  Evaluation of the left wrist demonstrates some mild swelling and bruising.  No obvious deformity.  Limited motion.  Fingers are warm well-perfused.  Sensation intact throughout the left hand  IMAGING: I personally ordered and reviewed the following images  X-rays of the left wrist were obtained in clinic today.  These were obtained in a cast.  Distal radius fracture, with minimal displacement is appreciated.  Multiple fracture lines are appreciated.  There is intra-articular extension, without obvious step-off or deformity.  No bony lesions.  Impression: Left distal radius fracture, with intra-articular split, minimal displacement and no obvious step-off   New Medications:  No orders of the defined types were  placed in this encounter.     Micheal DELENA Horde, MD  07/11/2023 4:08 PM

## 2023-07-11 NOTE — Patient Instructions (Signed)

## 2023-07-25 ENCOUNTER — Ambulatory Visit: Payer: BC Managed Care – PPO | Admitting: Orthopedic Surgery

## 2023-07-25 ENCOUNTER — Encounter: Payer: Self-pay | Admitting: Orthopedic Surgery

## 2023-07-25 ENCOUNTER — Telehealth: Payer: Self-pay | Admitting: Orthopedic Surgery

## 2023-07-25 ENCOUNTER — Other Ambulatory Visit (INDEPENDENT_AMBULATORY_CARE_PROVIDER_SITE_OTHER): Payer: Self-pay

## 2023-07-25 DIAGNOSIS — S52572D Other intraarticular fracture of lower end of left radius, subsequent encounter for closed fracture with routine healing: Secondary | ICD-10-CM

## 2023-07-25 DIAGNOSIS — S52572A Other intraarticular fracture of lower end of left radius, initial encounter for closed fracture: Secondary | ICD-10-CM | POA: Diagnosis not present

## 2023-07-25 MED ORDER — HYDROCODONE-ACETAMINOPHEN 5-325 MG PO TABS: 1.0000 | ORAL_TABLET | Freq: Four times a day (QID) | ORAL | 0 refills | Status: AC | PRN

## 2023-07-25 NOTE — Progress Notes (Signed)
Return patient Visit  Assessment: Micheal Bates is a 28 y.o. male with the following: 1. Other closed intra-articular fracture of distal end of left radius, subsequent encounter  Plan: CRESCENCIO MARKU fell and sustained a left distal radius fracture.  Injury was 2-3 weeks ago.  He has done well.  Radiographs remained stable.  He has been taking some hydrocodone at nighttime only.  No issues with his cast.  Remain out of work.  Repeat cast today.  At the next visit, we may consider transition to a removable wrist brace.  Cast application - Left short arm cast   Verbal consent was obtained and the correct extremity was identified. A well padded, appropriately molded short arm cast was applied to the Left arm Fingers remained warm and well perfused.   There were no sharp edges Patient tolerated the procedure well Cast care instructions were provided    Follow-up: Return in about 2 weeks (around 08/08/2023).  Subjective:  Chief Complaint  Patient presents with   Fracture    L Radius DOI 07/08/23    History of Present Illness: Micheal Bates is a 28 y.o. male who returns for repeat evaluation of left wrist pain.  Approximately 2-3 weeks ago, he fell while helping a friend move.  He sustained an intra-articular distal radius fracture.  At the last visit, he was placed in a cast.  He continues to have occasional pain.  He is moving his fingers.  No numbness or tingling.  He works as a Copywriter, advertising.  Review of Systems: No fevers or chills No numbness or tingling No chest pain No shortness of breath No bowel or bladder dysfunction No GI distress No headaches    Objective: There were no vitals taken for this visit.  Physical Exam:  General: Alert and oriented. and No acute distress. Gait: Normal gait.  Left wrist without deformity.  No swelling.  No redness.  No skin breakdown.  He is able to make a fist.  He tolerates gentle range of motion of the left wrist.  Fingers  are warm and well-perfused.  Sensation intact throughout the left hand.  IMAGING: I personally ordered and reviewed the following images  X-rays of the left wrist were obtained in clinic today.  These are compared to prior x-rays.  There has been no displacement of an intra-articular split.  There is no obvious step-off within the joint.  Overall alignment remains unchanged.  No subsidence.  No additional injuries.  No bony lesions.  Impression: Stable left intra-articular distal radius fracture, without obvious step-off  New Medications:  Meds ordered this encounter  Medications   HYDROcodone-acetaminophen (NORCO/VICODIN) 5-325 MG tablet    Sig: Take 1 tablet by mouth every 6 (six) hours as needed.    Dispense:  20 tablet    Refill:  0      Oliver Barre, MD  07/25/2023 3:47 PM

## 2023-07-25 NOTE — Patient Instructions (Signed)

## 2023-07-25 NOTE — Telephone Encounter (Signed)
Dr. Dallas Schimke pt - spoke w/the pt, he stated he was seen today and that his medication was sent to the wrong pharmacy, [facility] in [place], but he wants that cx and sent to [facility] in [place].  They told him they could not transfer it.

## 2023-08-08 ENCOUNTER — Encounter: Payer: BC Managed Care – PPO | Admitting: Orthopedic Surgery

## 2023-08-11 ENCOUNTER — Other Ambulatory Visit (INDEPENDENT_AMBULATORY_CARE_PROVIDER_SITE_OTHER): Payer: Self-pay

## 2023-08-11 ENCOUNTER — Ambulatory Visit (INDEPENDENT_AMBULATORY_CARE_PROVIDER_SITE_OTHER): Payer: BC Managed Care – PPO | Admitting: Orthopedic Surgery

## 2023-08-11 ENCOUNTER — Encounter: Payer: Self-pay | Admitting: Orthopedic Surgery

## 2023-08-11 DIAGNOSIS — S52572D Other intraarticular fracture of lower end of left radius, subsequent encounter for closed fracture with routine healing: Secondary | ICD-10-CM

## 2023-08-11 NOTE — Patient Instructions (Signed)
 Wear the brace at all times for the next 2 weeks.  Okay to remove for hygiene only.  In 2 weeks time, you can start to come out of the brace to work on range of motion exercises only.  Do not focus on strengthening.  Do not lift anything heavier than a coffee cup.   Please provide a note.  Out of work until the next visit.

## 2023-08-11 NOTE — Progress Notes (Signed)
 Return patient Visit  Assessment: Micheal Bates is a 28 y.o. male with the following: 1. Other closed intra-articular fracture of distal end of left radius, subsequent encounter  Plan: Micheal Bates fell and sustained a left distal radius fracture.  Injury was about 1 month ago.  Radiographs have remained stable.  He is not having any pain.  Minimal swelling on physical exam.  At this point, we will transition him to a brace.  He is to wear the brace at all times until the next visit.  He can remove the brace for hygiene only for the next 2 weeks.  In 2 weeks time, he can start coming out of the brace to work on gentle range of motion.  I have advised him not to focus on any strengthening.  I would work until the next visit.    Follow-up: Return in about 4 weeks (around 09/08/2023).  Subjective:  Chief Complaint  Patient presents with   Fracture    L Radius DOI 07/08/23    History of Present Illness: Micheal Bates is a 28 y.o. male who returns for repeat evaluation of left wrist pain.  Approximately 1 month ago, he fell while helping a friend move.  He sustained an intra-articular distal radius fracture.  He has been immobilized for a month.  He has done well.  He is not having any pain.  No numbness or tingling.   Review of Systems: No fevers or chills No numbness or tingling No chest pain No shortness of breath No bowel or bladder dysfunction No GI distress No headaches    Objective: There were no vitals taken for this visit.  Physical Exam:  General: Alert and oriented. and No acute distress. Gait: Normal gait.  Evaluation of the left wrist demonstrates no swelling.  No deformity.  No skin breakdown after removal of the cast.  He can make a full fist.  Fingers are warm and well-perfused.  Sensation is intact throughout the left hand.  IMAGING: I personally ordered and reviewed the following images  X-rays of the left wrist were obtained in clinic  today.  These are compared to available x-rays.  No interval displacement compared to prior x-rays.  There is an intra-articular split, without step-off.  No obvious callus formation.  No bony lesions.  Impression: Healing left distal radius fracture  New Medications:  No orders of the defined types were placed in this encounter.     Micheal DELENA Horde, MD  08/11/2023 10:45 AM

## 2023-09-12 ENCOUNTER — Ambulatory Visit (INDEPENDENT_AMBULATORY_CARE_PROVIDER_SITE_OTHER): Payer: BC Managed Care – PPO | Admitting: Orthopedic Surgery

## 2023-09-12 ENCOUNTER — Other Ambulatory Visit (INDEPENDENT_AMBULATORY_CARE_PROVIDER_SITE_OTHER): Payer: Self-pay

## 2023-09-12 ENCOUNTER — Encounter: Payer: Self-pay | Admitting: Orthopedic Surgery

## 2023-09-12 DIAGNOSIS — S52572D Other intraarticular fracture of lower end of left radius, subsequent encounter for closed fracture with routine healing: Secondary | ICD-10-CM

## 2023-09-12 NOTE — Progress Notes (Signed)
 Return patient Visit  Assessment: Micheal Bates is a 28 y.o. male with the following: 1. Other closed intra-articular fracture of distal end of left radius, subsequent encounter  Plan: Micheal Bates fell and sustained a left distal radius fracture.  Injury was approximately 2 months ago.  Radiographs with interval consolidation.  He has no pain in the left wrist.  Excellent strength.  Good range of motion.  He is already started with some work around the house.  At this point, he is ready to return to work.  I provided him with a note, allowing him to return to full duty effective immediately.  If he has any further issues, he will return to clinic.    Follow-up: Return if symptoms worsen or fail to improve.  Subjective:  Chief Complaint  Patient presents with   Fracture    L Radius DOI 07/08/23    History of Present Illness: Micheal Bates is a 28 y.o. male who returns for repeat evaluation of left wrist pain.  Approximately 2 months ago, he fell and sustained a left distal radius fracture.  He has been immobilized.  He transition to a brace.  He is now using his left wrist, without a brace.  He denies pain.  He feels as though he can return to full duty at work.  Review of Systems: No fevers or chills No numbness or tingling No chest pain No shortness of breath No bowel or bladder dysfunction No GI distress No headaches    Objective: There were no vitals taken for this visit.  Physical Exam:  General: Alert and oriented. and No acute distress. Gait: Normal gait.  Left wrist without swelling.  No redness.  No deformity.  Good grip strength.  Flexion and extension similar to the contralateral side.  Full range of motion of all fingers.  Sensation intact throughout the left hand.   IMAGING: I personally ordered and reviewed the following images  X-rays of the left wrist were obtained in clinic today.  These are compared to prior x-rays.  Distal radius  fracture, with intra-articular split remains in stable position.  There has been interval consolidation.  No additional subsidence.  No additional injuries.  No bony lesions.  Impression: Healed left distal radius fracture  New Medications:  No orders of the defined types were placed in this encounter.     Oliver Barre, MD  09/12/2023 2:25 PM

## 2023-09-12 NOTE — Patient Instructions (Signed)
 Note for work - ok to return to full duty, effective immediately
